# Patient Record
Sex: Female | Born: 1995 | Race: White | Hispanic: No | Marital: Single | Smoking: Former smoker
Health system: Southern US, Community
[De-identification: ages and names within clinical notes are randomized; demographics above are authoritative.]

## PROBLEM LIST (undated history)

## (undated) ENCOUNTER — Inpatient Hospital Stay (HOSPITAL_COMMUNITY): Payer: Self-pay

## (undated) DIAGNOSIS — Z789 Other specified health status: Secondary | ICD-10-CM

## (undated) HISTORY — PX: MOUTH SURGERY: SHX715

---
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
## 2017-10-15 NOTE — L&D Delivery Note (Signed)
Patient was C/C/+2 and pushed for  <710minutes with epidural.    NSVD female infant, Apgars 8/9, weight pending.  Port wine fluid noted at delivery with possible blood clots. The patient had a right periurethral laceration repaired with 3-0 vicryl. Fundus was firm. EBL was expected amount. Placenta was delivered intact. Vagina was clear.  Delayed cord clamping done for 30-60 seconds while warming baby. Baby was vigorous and doing skin to skin with mother.  Philip AspenALLAHAN, Hurschel Paynter

## 2018-05-28 ENCOUNTER — Inpatient Hospital Stay (HOSPITAL_COMMUNITY): Payer: BLUE CROSS/BLUE SHIELD | Admitting: Anesthesiology

## 2018-05-28 ENCOUNTER — Inpatient Hospital Stay (HOSPITAL_COMMUNITY)
Admission: AD | Admit: 2018-05-28 | Discharge: 2018-05-30 | DRG: 807 | Disposition: A | Discharge status: Home / Self Care | Payer: BLUE CROSS/BLUE SHIELD | Attending: Obstetrics and Gynecology | Admitting: Obstetrics and Gynecology

## 2018-05-28 ENCOUNTER — Other Ambulatory Visit: Payer: Self-pay

## 2018-05-28 ENCOUNTER — Encounter (HOSPITAL_COMMUNITY): Payer: Self-pay

## 2018-05-28 DIAGNOSIS — Z3A38 38 weeks gestation of pregnancy: Secondary | ICD-10-CM | POA: Diagnosis not present

## 2018-05-28 DIAGNOSIS — Z3483 Encounter for supervision of other normal pregnancy, third trimester: Secondary | ICD-10-CM | POA: Diagnosis present

## 2018-05-28 DIAGNOSIS — Z87891 Personal history of nicotine dependence: Secondary | ICD-10-CM | POA: Diagnosis not present

## 2018-05-28 LAB — CBC
HCT: 35.6 % — ABNORMAL LOW (ref 36.0–46.0)
Hemoglobin: 12 g/dL (ref 12.0–15.0)
MCH: 29.6 pg (ref 26.0–34.0)
MCHC: 33.7 g/dL (ref 30.0–36.0)
MCV: 87.9 fL (ref 78.0–100.0)
PLATELETS: 291 10*3/uL (ref 150–400)
RBC: 4.05 MIL/uL (ref 3.87–5.11)
RDW: 12.6 % (ref 11.5–15.5)
WBC: 17.8 10*3/uL — ABNORMAL HIGH (ref 4.0–10.5)

## 2018-05-28 LAB — TYPE AND SCREEN
ABO/RH(D): A POS
Antibody Screen: NEGATIVE

## 2018-05-28 MED ORDER — OXYTOCIN 40 UNITS IN LACTATED RINGERS INFUSION - SIMPLE MED
2.5000 [IU]/h | INTRAVENOUS | Status: DC
  Filled 2018-05-28: qty 1000

## 2018-05-28 MED ORDER — PHENYLEPHRINE 40 MCG/ML (10ML) SYRINGE FOR IV PUSH (FOR BLOOD PRESSURE SUPPORT)
PREFILLED_SYRINGE | INTRAVENOUS | Status: AC
  Filled 2018-05-28: qty 10

## 2018-05-28 MED ORDER — ONDANSETRON HCL 4 MG/2ML IJ SOLN: 4.0000 mg | Freq: Four times a day (QID) | INTRAMUSCULAR | Status: DC | PRN

## 2018-05-28 MED ORDER — EPHEDRINE 5 MG/ML INJ
10.0000 mg | INTRAVENOUS | Status: DC | PRN
  Filled 2018-05-28: qty 2

## 2018-05-28 MED ORDER — FENTANYL 2.5 MCG/ML BUPIVACAINE 1/10 % EPIDURAL INFUSION (WH - ANES)
14.0000 mL/h | INTRAMUSCULAR | Status: DC | PRN
  Administered 2018-05-28: 14 mL/h via EPIDURAL

## 2018-05-28 MED ORDER — LACTATED RINGERS IV SOLN: 500.0000 mL | INTRAVENOUS | Status: DC | PRN

## 2018-05-28 MED ORDER — PHENYLEPHRINE 40 MCG/ML (10ML) SYRINGE FOR IV PUSH (FOR BLOOD PRESSURE SUPPORT)
80.0000 ug | PREFILLED_SYRINGE | INTRAVENOUS | Status: DC | PRN
  Filled 2018-05-28: qty 5

## 2018-05-28 MED ORDER — ACETAMINOPHEN 325 MG PO TABS: 650.0000 mg | ORAL_TABLET | ORAL | Status: DC | PRN

## 2018-05-28 MED ORDER — OXYTOCIN BOLUS FROM INFUSION
500.0000 mL | Freq: Once | INTRAVENOUS | Status: AC
  Administered 2018-05-29: 500 mL via INTRAVENOUS

## 2018-05-28 MED ORDER — OXYCODONE-ACETAMINOPHEN 5-325 MG PO TABS: 2.0000 | ORAL_TABLET | ORAL | Status: DC | PRN

## 2018-05-28 MED ORDER — OXYCODONE-ACETAMINOPHEN 5-325 MG PO TABS: 1.0000 | ORAL_TABLET | ORAL | Status: DC | PRN

## 2018-05-28 MED ORDER — LIDOCAINE HCL (PF) 1 % IJ SOLN
30.0000 mL | INTRAMUSCULAR | Status: DC | PRN
  Administered 2018-05-29: 30 mL via SUBCUTANEOUS
  Filled 2018-05-28: qty 30

## 2018-05-28 MED ORDER — LACTATED RINGERS IV SOLN: 500.0000 mL | Freq: Once | INTRAVENOUS | Status: DC

## 2018-05-28 MED ORDER — FENTANYL 2.5 MCG/ML BUPIVACAINE 1/10 % EPIDURAL INFUSION (WH - ANES)
INTRAMUSCULAR | Status: AC
  Filled 2018-05-28: qty 100

## 2018-05-28 MED ORDER — DIPHENHYDRAMINE HCL 50 MG/ML IJ SOLN: 12.5000 mg | INTRAMUSCULAR | Status: DC | PRN

## 2018-05-28 MED ORDER — SOD CITRATE-CITRIC ACID 500-334 MG/5ML PO SOLN: 30.0000 mL | ORAL | Status: DC | PRN

## 2018-05-28 MED ORDER — LIDOCAINE HCL (PF) 1 % IJ SOLN
INTRAMUSCULAR | Status: DC | PRN
  Administered 2018-05-28: 7 mL via EPIDURAL
  Administered 2018-05-28: 6 mL via EPIDURAL

## 2018-05-28 MED ORDER — LACTATED RINGERS IV SOLN
INTRAVENOUS | Status: DC
  Administered 2018-05-28 (×2): via INTRAVENOUS

## 2018-05-28 NOTE — MAU Note (Signed)
Pt states that she has been contracting every 6-8 minutes for the last 2 hours.   Reports +FM   Denies vaginal bleeding, or LOF.

## 2018-05-28 NOTE — Anesthesia Preprocedure Evaluation (Signed)
Anesthesia Evaluation  Patient identified by MRN, date of birth, ID band Patient awake    Reviewed: Allergy & Precautions, H&P , NPO status , Patient's Chart, lab work & pertinent test results  Airway Mallampati: I  TM Distance: >3 FB Neck ROM: full    Dental no notable dental hx. (+) Teeth Intact   Pulmonary neg pulmonary ROS,    Pulmonary exam normal breath sounds clear to auscultation       Cardiovascular negative cardio ROS Normal cardiovascular exam Rhythm:regular Rate:Normal     Neuro/Psych negative neurological ROS  negative psych ROS   GI/Hepatic negative GI ROS, Neg liver ROS,   Endo/Other  negative endocrine ROS  Renal/GU negative Renal ROS  negative genitourinary   Musculoskeletal negative musculoskeletal ROS (+)   Abdominal Normal abdominal exam  (+)   Peds  Hematology negative hematology ROS (+)   Anesthesia Other Findings   Reproductive/Obstetrics (+) Pregnancy                             Anesthesia Physical Anesthesia Plan  ASA: II  Anesthesia Plan: Epidural   Post-op Pain Management:    Induction:   PONV Risk Score and Plan:   Airway Management Planned:   Additional Equipment:   Intra-op Plan:   Post-operative Plan:   Informed Consent: I have reviewed the patients History and Physical, chart, labs and discussed the procedure including the risks, benefits and alternatives for the proposed anesthesia with the patient or authorized representative who has indicated his/her understanding and acceptance.       Plan Discussed with:   Anesthesia Plan Comments:         Anesthesia Quick Evaluation  

## 2018-05-28 NOTE — Anesthesia Procedure Notes (Signed)
Epidural Patient location during procedure: OB Start time: 05/28/2018 11:44 PM End time: 05/28/2018 11:48 PM  Staffing Anesthesiologist: Leilani AbleHatchett, Lakeyshia Tuckerman, MD Performed: anesthesiologist   Preanesthetic Checklist Completed: patient identified, site marked, surgical consent, pre-op evaluation, timeout performed, IV checked, risks and benefits discussed and monitors and equipment checked  Epidural Patient position: sitting Prep: site prepped and draped and DuraPrep Patient monitoring: continuous pulse ox and blood pressure Approach: midline Location: L3-L4 Injection technique: LOR air  Needle:  Needle type: Tuohy  Needle gauge: 17 G Needle length: 9 cm and 9 Needle insertion depth: 6 cm Catheter type: closed end flexible Catheter size: 19 Gauge Catheter at skin depth: 11 cm Test dose: negative and Other  Assessment Sensory level: T9 Events: blood not aspirated, injection not painful, no injection resistance, negative IV test and no paresthesia  Additional Notes Reason for block:procedure for pain

## 2018-05-28 NOTE — H&P (Signed)
22 y.o. 3365w5d  G3P1011 comes in c/o contractions.  Otherwise has good fetal movement and no bleeding.  Past Medical History:  Diagnosis Date  . Medical history non-contributory     Past Surgical History:  Procedure Laterality Date  . MOUTH SURGERY      OB History  Gravida Para Term Preterm AB Living  3 1 1   1 1   SAB TAB Ectopic Multiple Live Births    1   0 1    # Outcome Date GA Lbr Len/2nd Weight Sex Delivery Anes PTL Lv  3 Current           2 Term 06/09/15 3971w4d 13:49 / 00:51 2910 g F Vag-Spont EPI  LIV  1 TAB             Social History   Socioeconomic History  . Marital status: Single    Spouse name: Not on file  . Number of children: Not on file  . Years of education: Not on file  . Highest education level: Not on file  Occupational History  . Not on file  Social Needs  . Financial resource strain: Not on file  . Food insecurity:    Worry: Not on file    Inability: Not on file  . Transportation needs:    Medical: Not on file    Non-medical: Not on file  Tobacco Use  . Smoking status: Former Games developermoker  . Smokeless tobacco: Never Used  . Tobacco comment: 4-5 years ago  Substance and Sexual Activity  . Alcohol use: No  . Drug use: No  . Sexual activity: Yes    Birth control/protection: None    Comment: vasectomy  Lifestyle  . Physical activity:    Days per week: Not on file    Minutes per session: Not on file  . Stress: Not on file  Relationships  . Social connections:    Talks on phone: Not on file    Gets together: Not on file    Attends religious service: Not on file    Active member of club or organization: Not on file    Attends meetings of clubs or organizations: Not on file    Relationship status: Not on file  . Intimate partner violence:    Fear of current or ex partner: Not on file    Emotionally abused: Not on file    Physically abused: Not on file    Forced sexual activity: Not on file  Other Topics Concern  . Not on file  Social History  Narrative  . Not on file   Coconut flavor    Prenatal Transfer Tool  Maternal Diabetes: No Genetic Screening: Normal Maternal Ultrasounds/Referrals: Normal Fetal Ultrasounds or other Referrals:  None Maternal Substance Abuse:  No Significant Maternal Medications:  None Significant Maternal Lab Results: Lab values include: Group B Strep negative  Other PNC: uncomplicated.    Vitals:   05/28/18 2300 05/28/18 2302 05/28/18 2305 05/28/18 2310  BP: 96/81  (!) 103/59 114/67  Pulse: (!) 103  (!) 159 99  Resp:      Temp:      TempSrc:      SpO2: 99%  100% 100%  Weight:  71.7 kg    Height:  5\' 1"  (1.549 m)      Lungs/Cor:  NAD Abdomen:  soft, gravid Ex:  no cords, erythema SVE:  4/80/-2 FHTs:  130, good STV, recent decels noted Toco:  q2-4   A/P  Admitted with labor at 38.5  GBS Neg  Epidural placed  Anticipate AROM  Decel interventions currently and overall reassuring  Gregor Dershem

## 2018-05-29 ENCOUNTER — Encounter (HOSPITAL_COMMUNITY): Payer: Self-pay

## 2018-05-29 LAB — CBC
HCT: 32.3 % — ABNORMAL LOW (ref 36.0–46.0)
Hemoglobin: 11.3 g/dL — ABNORMAL LOW (ref 12.0–15.0)
MCH: 30.5 pg (ref 26.0–34.0)
MCHC: 35 g/dL (ref 30.0–36.0)
MCV: 87.3 fL (ref 78.0–100.0)
PLATELETS: 250 10*3/uL (ref 150–400)
RBC: 3.7 MIL/uL — AB (ref 3.87–5.11)
RDW: 12.5 % (ref 11.5–15.5)
WBC: 27.5 10*3/uL — ABNORMAL HIGH (ref 4.0–10.5)

## 2018-05-29 MED ORDER — ONDANSETRON HCL 4 MG/2ML IJ SOLN: 4.0000 mg | INTRAMUSCULAR | Status: DC | PRN

## 2018-05-29 MED ORDER — TETANUS-DIPHTH-ACELL PERTUSSIS 5-2.5-18.5 LF-MCG/0.5 IM SUSP: 0.5000 mL | Freq: Once | INTRAMUSCULAR | Status: DC

## 2018-05-29 MED ORDER — ZOLPIDEM TARTRATE 5 MG PO TABS: 5.0000 mg | ORAL_TABLET | Freq: Every evening | ORAL | Status: DC | PRN

## 2018-05-29 MED ORDER — OXYCODONE-ACETAMINOPHEN 5-325 MG PO TABS: 2.0000 | ORAL_TABLET | ORAL | Status: DC | PRN

## 2018-05-29 MED ORDER — DIBUCAINE 1 % RE OINT: 1.0000 "application " | TOPICAL_OINTMENT | RECTAL | Status: DC | PRN

## 2018-05-29 MED ORDER — SENNOSIDES-DOCUSATE SODIUM 8.6-50 MG PO TABS
2.0000 | ORAL_TABLET | ORAL | Status: DC
  Administered 2018-05-29: 2 via ORAL
  Filled 2018-05-29: qty 2

## 2018-05-29 MED ORDER — BENZOCAINE-MENTHOL 20-0.5 % EX AERO
1.0000 "application " | INHALATION_SPRAY | CUTANEOUS | Status: DC | PRN
  Administered 2018-05-29: 1 via TOPICAL
  Filled 2018-05-29: qty 56

## 2018-05-29 MED ORDER — DIPHENHYDRAMINE HCL 25 MG PO CAPS: 25.0000 mg | ORAL_CAPSULE | Freq: Four times a day (QID) | ORAL | Status: DC | PRN

## 2018-05-29 MED ORDER — IBUPROFEN 600 MG PO TABS
600.0000 mg | ORAL_TABLET | Freq: Four times a day (QID) | ORAL | Status: DC
  Administered 2018-05-29 – 2018-05-30 (×5): 600 mg via ORAL
  Filled 2018-05-29 (×6): qty 1

## 2018-05-29 MED ORDER — SIMETHICONE 80 MG PO CHEW: 80.0000 mg | CHEWABLE_TABLET | ORAL | Status: DC | PRN

## 2018-05-29 MED ORDER — WITCH HAZEL-GLYCERIN EX PADS: 1.0000 "application " | MEDICATED_PAD | CUTANEOUS | Status: DC | PRN

## 2018-05-29 MED ORDER — OXYCODONE-ACETAMINOPHEN 5-325 MG PO TABS: 1.0000 | ORAL_TABLET | ORAL | Status: DC | PRN

## 2018-05-29 MED ORDER — COCONUT OIL OIL: 1.0000 "application " | TOPICAL_OIL | Status: DC | PRN

## 2018-05-29 MED ORDER — PRENATAL MULTIVITAMIN CH
1.0000 | ORAL_TABLET | Freq: Every day | ORAL | Status: DC
  Administered 2018-05-29 – 2018-05-30 (×2): 1 via ORAL
  Filled 2018-05-29 (×2): qty 1

## 2018-05-29 MED ORDER — ONDANSETRON HCL 4 MG PO TABS: 4.0000 mg | ORAL_TABLET | ORAL | Status: DC | PRN

## 2018-05-29 MED ORDER — ACETAMINOPHEN 325 MG PO TABS: 650.0000 mg | ORAL_TABLET | ORAL | Status: DC | PRN

## 2018-05-29 NOTE — Progress Notes (Signed)
PPD#0 Pt without complaints. Peds has not seen baby VSSAF IMP/ Doing well Plan/ Routine care

## 2018-05-29 NOTE — Anesthesia Postprocedure Evaluation (Signed)
Anesthesia Post Note  Patient: Erin Bauer  Procedure(s) Performed: AN AD HOC LABOR EPIDURAL     Patient location during evaluation: Mother Baby Anesthesia Type: Epidural Level of consciousness: awake Pain management: pain level controlled Vital Signs Assessment: post-procedure vital signs reviewed and stable Respiratory status: spontaneous breathing Cardiovascular status: stable Postop Assessment: epidural receding and patient able to bend at knees Anesthetic complications: no    Last Vitals:  Vitals:   05/29/18 0329 05/29/18 0730  BP: 121/65 (!) 117/59  Pulse: 99 97  Resp: 16 16  Temp: 37.1 C 36.8 C  SpO2: 98% 98%    Last Pain:  Vitals:   05/29/18 0730  TempSrc: Oral  PainSc:    Pain Goal:                 Edison PaceWILKERSON,Suhailah Kwan

## 2018-05-30 LAB — RPR: RPR: NONREACTIVE

## 2018-05-30 MED ORDER — DOCUSATE SODIUM 100 MG PO CAPS: 100.0000 mg | ORAL_CAPSULE | Freq: Two times a day (BID) | ORAL | 0 refills | Status: AC

## 2018-05-30 MED ORDER — IBUPROFEN 600 MG PO TABS: 600.0000 mg | ORAL_TABLET | Freq: Four times a day (QID) | ORAL | 0 refills | Status: AC | PRN

## 2018-05-30 NOTE — Discharge Summary (Signed)
Obstetric Discharge Summary Reason for Admission: onset of labor Prenatal Procedures: ultrasound Intrapartum Procedures: spontaneous vaginal delivery Postpartum Procedures: none Complications-Operative and Postpartum: periurethral laceration Hemoglobin  Date Value Ref Range Status  05/29/2018 11.3 (L) 12.0 - 15.0 g/dL Final   HCT  Date Value Ref Range Status  05/29/2018 32.3 (L) 36.0 - 46.0 % Final    Physical Exam:  General: alert, cooperative and appears stated age 54Lochia: appropriate Uterine Fundus: firm DVT Evaluation: No evidence of DVT seen on physical exam.  Discharge Diagnoses: Term Pregnancy-delivered  Discharge Information: Date: 05/30/2018 Activity: pelvic rest Diet: routine Medications: Ibuprofen and Colace Condition: improved Instructions: refer to practice specific booklet Discharge to: home Follow-up Information    Levi AlandAnderson, Mark E, MD Follow up in 4 week(s).   Specialty:  Obstetrics and Gynecology Why:  For a postpartum evaluation Contact information: [address] [place] [phone]           Newborn Data: Live born female  Birth Weight: 6 lb 12.3 oz (3070 g) APGAR: 8, 9  Newborn Delivery   Birth date/time:  05/29/2018 00:39:00 Delivery type:  Vaginal, Spontaneous     Home with mother.  Erin Bauer 05/30/2018, 10:55 AM

## 2024-05-01 ENCOUNTER — Encounter: Payer: Self-pay | Admitting: Advanced Practice Midwife
